# Patient Record
Sex: Male | Born: 2000 | Race: White | Hispanic: No | Marital: Single | Smoking: Never smoker
Health system: Southern US, Community
[De-identification: ages and names within clinical notes are randomized; demographics above are authoritative.]

---
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
## 2018-01-22 ENCOUNTER — Encounter: Payer: Self-pay | Admitting: Family Medicine

## 2018-01-22 ENCOUNTER — Ambulatory Visit (INDEPENDENT_AMBULATORY_CARE_PROVIDER_SITE_OTHER): Payer: 59 | Admitting: Family Medicine

## 2018-01-22 VITALS — BP 126/72 | HR 80 | Temp 98.1°F | Resp 12 | Ht 72.0 in | Wt 128.0 lb

## 2018-01-22 DIAGNOSIS — H9202 Otalgia, left ear: Secondary | ICD-10-CM | POA: Diagnosis not present

## 2018-01-22 DIAGNOSIS — J069 Acute upper respiratory infection, unspecified: Secondary | ICD-10-CM

## 2018-01-22 MED ORDER — NEOMYCIN-POLYMYXIN-HC 3.5-10000-1 OT SOLN: 3.0000 [drp] | Freq: Four times a day (QID) | OTIC | 0 refills | Status: AC

## 2018-01-22 NOTE — Progress Notes (Signed)
Patient ID: Henry Garrison, male    DOB: [birth year], 17 y.o.   MRN: [id number]  PCP: Donita BrooksPickard, Warren T, MD  Chief Complaint  Patient presents with  . Left ear pain    Subjective:   Henry ModenaZachary Hornaday is a 17 y.o. male, presents to clinic with CC of recurrent left ear pain, described as a lot pressure, pain rated 9/10, onset last night, has been constant since then.  He has a history of recurrent ear infections.  He has not tried any medications or drops for his pain since the beginning.  He reports cleaning his ears with q-tips after swimming and after showers (swimming maybe 2x a month).  He did clean his ear prior to the onset of pain.   He denies any associated redness, ear drainage, decreased hearing, headaches, URI symptoms, fever, lymphadenopathy, sweats.   Patient Active Problem List   Diagnosis Date Noted  . 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 Hyperextension injury of right knee 09/09/2014  . 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 Hypertension Mother   . Cancer Father        cervical  . Asthma Sister   . Hypertension Maternal Grandmother   . Hypertension Maternal Grandfather      Social History   Socioeconomic History  . Marital status: Single    Spouse name: Not on file  . Number of children: Not on file  . Years of education: Not on file  . Highest education level: Not on file  Occupational History  . Not on file  Social Needs  . Financial resource strain: Not on file  . Food insecurity:    Worry: Not on file    Inability: Not on file  . Transportation needs:    Medical: Not on file    Non-medical: Not on file  Tobacco Use  . Smoking status: Never Smoker  . Smokeless tobacco: Former Engineer, waterUser  Substance and Sexual Activity  . Alcohol use: No    Alcohol/week: 0.0 oz  . Drug use: No  . Sexual  activity: Never  Lifestyle  . Physical activity:    Days per week: Not on file    Minutes per session: Not on file  . Stress: Not on file  Relationships  . Social connections:    Talks on phone: Not on file    Gets together: Not on file    Attends religious service: Not on file    Active member of club or organization: Not on file    Attends meetings of clubs or organizations: Not on file    Relationship status: Not on file  . Intimate partner violence:    Fear of current or ex partner: Not on file    Emotionally abused: Not on file    Physically abused: Not on file    Forced sexual activity: Not on file  Other Topics Concern  . Not on file  Social History Narrative   ** Merged History Encounter **         Review of Systems  Constitutional: Negative.   HENT: Negative.   Eyes: Negative.   Respiratory: Negative.   Cardiovascular: Negative.   Gastrointestinal: Negative.   Endocrine: Negative.   Genitourinary: Negative.   Musculoskeletal: Negative.   Skin: Negative.   Allergic/Immunologic: Negative.  Neurological: Negative.   Hematological: Negative.   Psychiatric/Behavioral: Negative.   All other systems reviewed and are negative.      Objective:    Vitals:   01/22/18 1110  BP: 126/72  Pulse: 80  Resp: 12  Temp: 98.1 F (36.7 C)  TempSrc: Oral  SpO2: 99%  Weight: 128 lb (58.1 kg)  Height: 6' (1.829 m)      Physical Exam  Constitutional: He appears well-developed.  HENT:  Head: Normocephalic and atraumatic. Head is without right periorbital erythema and without left periorbital erythema.  Right Ear: Hearing, tympanic membrane, external ear and ear canal normal.  Left Ear: Hearing, tympanic membrane and external ear normal. No lacerations. There is tenderness. No drainage or swelling. No mastoid tenderness. Tympanic membrane is not injected and not bulging.  No middle ear effusion. No hemotympanum.  Nose: Mucosal edema and rhinorrhea present.    Mouth/Throat: Uvula is midline. Posterior oropharyngeal erythema present. No oropharyngeal exudate, posterior oropharyngeal edema or tonsillar abscesses.  Mild left external auditory canal erythema  Eyes: Conjunctivae are normal. Right eye exhibits no discharge. Left eye exhibits no discharge.  Neck: No tracheal deviation present.  Cardiovascular: Normal rate and regular rhythm.  Pulmonary/Chest: Effort normal. No stridor. No respiratory distress.  Musculoskeletal: Normal range of motion.  Neurological: He is alert. He exhibits normal muscle tone. Coordination normal.  Skin: Skin is warm and dry. No rash noted.  Psychiatric: He has a normal mood and affect. His behavior is normal.  Nursing note and vitals reviewed.         Assessment & Plan:      ICD-10-CM   1. Otalgia of left ear H92.02 neomycin-polymyxin-hydrocortisone (CORTISPORIN) OTIC solution  2. Upper respiratory tract infection, unspecified type J06.9     Patient presents with left ear pain reports a history of recurrent outer ear infections requiring oral antibiotics, he denies any other symptoms.  He does use Q-tips in his ears a lot.  Left external auditory canal is mildly erythematous without any other abnormalities of his left ear.  He appears to have mild URI with nasal mucosa and posterior oropharynx erythema.  May have early otitis externa or referred ear pain from URI.  Because of the chronic nature of his otalgia will give Cortisporin drops.  Instructed patient not to use Q-tips anymore.  Can use allergy medications such as antihistamines, nasal steroids or decongestants to try and help with symptoms.  Patient to follow-up if he has any worsening of URI or ear symptoms.  Patient verbalizes understanding of plan.   Danelle Berry, PA-C 01/22/18 11:36 AM

## 2018-01-22 NOTE — Patient Instructions (Addendum)
Call Friday if worsening ear pain   Ear Drops, Adult Your doctor has found that you have a condition that requires you to use ear drops. Ear drops are a medicine that is placed in the ear. This sheet gives you information about how to use this medicine. Your doctor may also give you more specific instructions. Supplies needed:  Cotton ball.  Medicine. How to put ear drops into your ear 1. Wash your hands with soap and water. 2. Make sure your ears are clean and dry. 3. Warm the medicine by holding it in your hand for a few minutes. 4. Shake the medicine to mix the ear drops. 5. Use the tube to get the medicine. You will need to squeeze the round part of the tube to do this. 6. Put the drops in your ear as told. Hold the tube above your ear. Do not let the tube touch your ear. The medicine may go in easier if you pull the flap of your ear up and back while you put the drops in. 7. To make sure your ear soaks up the medicine, do one of these things: ? Lie down for 10 minutes. The ear with the medicine should face up. ? Put a cotton ball in your ear. Do not push it deeper into your ear. Take out the cotton ball when the drops have been soaked up. 8. If you need to put drops in your other ear, repeat the same steps. Your doctor will tell you if you should put drops in both ears. Follow these instructions at home:  Use the ear drops for as long as your doctor tells you to. Do not stop even if your symptoms get better.  Keep the ear drops at room temperature.  Keep all follow-up visits as told by your doctor. This is important. Contact a doctor if:  Your condition gets worse.  Your pain gets worse.  Unusual fluid (drainage) is coming from your ear, especially if the fluid stinks.  You have trouble hearing. Get help right away if:  You feel like the room is spinning and you feel sick to your stomach. This condition is called vertigo.  The outside of your ear becomes red or  swollen.  You have a very bad headache. Summary  Ear drops are a medicine that is put in the ear.  Put the drops in your ear as told by your doctor.  Use the ear drops for as long as your doctor tells you to. Do not stop even if your symptoms get better. This information is not intended to replace advice given to you by your health care provider. Make sure you discuss any questions you have with your health care provider. Document Released: 11/29/2009 Document Revised: 06/15/2016 Document Reviewed: 06/15/2016 Elsevier Interactive Patient Education  2017 [employer] Inc.   Viral Respiratory Infection A viral respiratory infection is an illness that affects parts of the body used for breathing, like the lungs, nose, and throat. It is caused by a germ called a virus. Some examples of this kind of infection are:  A cold.  The flu (influenza).  A respiratory syncytial virus (RSV) infection.  How do I know if I have this infection? Most of the time this infection causes:  A stuffy or runny nose.  Yellow or green fluid in the nose.  A cough.  Sneezing.  Tiredness (fatigue).  Achy muscles.  A sore throat.  Sweating or chills.  A fever.  A headache.  How is this infection treated? If the flu is diagnosed early, it may be treated with an antiviral medicine. This medicine shortens the length of time a person has symptoms. Symptoms may be treated with over-the-counter and prescription medicines, such as:  Expectorants. These make it easier to cough up mucus.  Decongestant nasal sprays.  Doctors do not prescribe antibiotic medicines for viral infections. They do not work with this kind of infection. How do I know if I should stay home? To keep others from getting sick, stay home if you have:  A fever.  A lasting cough.  A sore throat.  A runny nose.  Sneezing.  Muscles aches.  Headaches.  Tiredness.  Weakness.  Chills.  Sweating.  An upset stomach  (nausea).  Follow these instructions at home:  Rest as much as possible.  Take over-the-counter and prescription medicines only as told by your doctor.  Drink enough fluid to keep your pee (urine) clear or pale yellow.  Gargle with salt water. Do this 3-4 times per day or as needed. To make a salt-water mixture, dissolve -1 tsp of salt in 1 cup of warm water. Make sure the salt dissolves all the way.  Use nose drops made from salt water. This helps with stuffiness (congestion). It also helps soften the skin around your nose.  Do not drink alcohol.  Do not use tobacco products, including cigarettes, chewing tobacco, and e-cigarettes. If you need help quitting, ask your doctor. Get help if:  Your symptoms last for 10 days or longer.  Your symptoms get worse over time.  You have a fever.  You have very bad pain in your face or forehead.  Parts of your jaw or neck become very swollen. Get help right away if:  You feel pain or pressure in your chest.  You have shortness of breath.  You faint or feel like you will faint.  You keep throwing up (vomiting).  You feel confused. This information is not intended to replace advice given to you by your health care provider. Make sure you discuss any questions you have with your health care provider. Document Released: 05/24/2008 Document Revised: 11/17/2015 Document Reviewed: 11/17/2014 Elsevier Interactive Patient Education  2018 [employer]

## 2018-02-11 ENCOUNTER — Emergency Department (HOSPITAL_COMMUNITY)
Admission: EM | Admit: 2018-02-11 | Discharge: 2018-02-11 | Disposition: A | Discharge status: Home / Self Care | Payer: BLUE CROSS/BLUE SHIELD | Attending: Emergency Medicine | Admitting: Emergency Medicine

## 2018-02-11 ENCOUNTER — Other Ambulatory Visit: Payer: Self-pay

## 2018-02-11 ENCOUNTER — Encounter (HOSPITAL_COMMUNITY): Payer: Self-pay | Admitting: *Deleted

## 2018-02-11 DIAGNOSIS — H5713 Ocular pain, bilateral: Secondary | ICD-10-CM | POA: Diagnosis not present

## 2018-02-11 DIAGNOSIS — H16133 Photokeratitis, bilateral: Secondary | ICD-10-CM | POA: Insufficient documentation

## 2018-02-11 MED ORDER — FLUORESCEIN SODIUM 1 MG OP STRP
1.0000 | ORAL_STRIP | Freq: Once | OPHTHALMIC | Status: AC
  Administered 2018-02-11: 1 via OPHTHALMIC
  Filled 2018-02-11: qty 1

## 2018-02-11 MED ORDER — TETRACAINE HCL 0.5 % OP SOLN
1.0000 [drp] | Freq: Once | OPHTHALMIC | Status: AC
  Administered 2018-02-11: 1 [drp] via OPHTHALMIC
  Filled 2018-02-11: qty 4

## 2018-02-11 MED ORDER — ERYTHROMYCIN 5 MG/GM OP OINT: TOPICAL_OINTMENT | OPHTHALMIC | 0 refills | Status: DC

## 2018-02-11 MED ORDER — HYDROCODONE-ACETAMINOPHEN 5-325 MG PO TABS: 1.0000 | ORAL_TABLET | Freq: Four times a day (QID) | ORAL | 0 refills | Status: DC | PRN

## 2018-02-11 NOTE — ED Provider Notes (Signed)
[facility]   CSN: [id number] Arrival date & time: 02/11/18  0448     History   Chief Complaint Chief Complaint  Patient presents with  . Eye Injury    HPI Henry Garrison is a 17 y.o. male.  Patient presents to the emergency department with a chief complaint of bilateral eye pain.  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  He states that by the time he had gone to bed he was starting to feel eye pain in both eyes.  He states that he was awakened this morning with severe eye pain. It is worsened when he opens his eyes. He denies flashes or floaters.  The history is provided by the patient. No language interpreter was used.    History reviewed. No pertinent past medical history.  Patient Active Problem List   Diagnosis Date Noted  . 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 Hyperextension injury of right knee 09/09/2014  . 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 No pertinent surgical history.      Home Medications    Prior to Admission medications   Not on File    Family History Family History  Problem Relation Age of Onset  . Hypertension Mother   . Cancer Father        cervical  . Asthma Sister   . Hypertension Maternal Grandmother   . Hypertension Maternal Grandfather     Social History Social History   Tobacco Use  . Smoking status: Never Smoker  . Smokeless tobacco: Former Engineer, waterUser  Substance Use Topics  . Alcohol use: No    Alcohol/week: 0.0 standard drinks  . Drug use: No     Allergies   Patient has no known allergies.   Review of Systems Review of Systems  All other systems reviewed and are negative.    Physical Exam Updated Vital Signs BP (!) 135/94   Pulse (!) 58   Temp 97.6 F (36.4 C)   Resp 16   SpO2 97%   Physical Exam  Constitutional: He is oriented to  person, place, and time. No distress.  HENT:  Head: Normocephalic and atraumatic.  Eyes: Pupils are equal, round, and reactive to light. Conjunctivae and EOM are normal.  Trace fluorescein uptake/dimpling  Neck: No tracheal deviation present.  Cardiovascular: Normal rate.  Pulmonary/Chest: Effort normal. No respiratory distress.  Abdominal: Soft.  Musculoskeletal: Normal range of motion.  Neurological: He is alert and oriented to person, place, and time.  Skin: Skin is warm and dry. He is not diaphoretic.  Psychiatric: Judgment normal.  Nursing note and vitals reviewed.    ED Treatments / Results  Labs (all labs ordered are listed, but only abnormal results are displayed) Labs Reviewed - No data to display  EKG None  Radiology No results found.  Procedures Procedures (including critical care time)  Medications Ordered in ED Medications  tetracaine (PONTOCAINE) 0.5 % ophthalmic solution 1 drop (has no administration in time range)  fluorescein ophthalmic strip 1 strip (has no administration in time range)     Initial Impression / Assessment and Plan / ED Course  I have reviewed the triage vital signs and the nursing notes.  Pertinent labs & imaging results that were available during my care of the patient were reviewed by me and considered in my medical decision making (see chart  for details).     Patient with photokeratitis from welder's flash. Will give some pain medicine and romycin.  Ophthalmology follow-up.  Final Clinical Impressions(s) / ED Diagnoses   Final diagnoses:  Welders' flash, bilateral    ED Discharge Orders         Ordered    erythromycin ophthalmic ointment  Status:  Discontinued     02/11/18 0538    erythromycin ophthalmic ointment     02/11/18 0539    HYDROcodone-acetaminophen (NORCO/VICODIN) 5-325 MG tablet  Every 6 hours PRN     02/11/18 0539           Roxy HorsemanBrowning, Osualdo Hansell, PA-C 02/11/18 0542    Eudelia Bunchardama, Amadeo GarnetPedro Eduardo,  MD 02/11/18 [phone]

## 2018-02-11 NOTE — ED Triage Notes (Signed)
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 Reports being able to see but has increased pain when eyes are open

## 2018-06-10 ENCOUNTER — Encounter: Payer: Self-pay | Admitting: Family Medicine

## 2018-06-10 ENCOUNTER — Ambulatory Visit (INDEPENDENT_AMBULATORY_CARE_PROVIDER_SITE_OTHER): Payer: BLUE CROSS/BLUE SHIELD | Admitting: Family Medicine

## 2018-06-10 VITALS — BP 100/64 | HR 129 | Temp 100.6°F | Resp 14 | Ht 72.0 in | Wt 128.0 lb

## 2018-06-10 DIAGNOSIS — R509 Fever, unspecified: Secondary | ICD-10-CM | POA: Diagnosis not present

## 2018-06-10 DIAGNOSIS — R52 Pain, unspecified: Secondary | ICD-10-CM | POA: Diagnosis not present

## 2018-06-10 LAB — INFLUENZA A AND B AG, IMMUNOASSAY
INFLUENZA A ANTIGEN: NOT DETECTED
INFLUENZA B ANTIGEN: NOT DETECTED

## 2018-06-10 MED ORDER — OSELTAMIVIR PHOSPHATE 75 MG PO CAPS: 75.0000 mg | ORAL_CAPSULE | Freq: Two times a day (BID) | ORAL | 0 refills | Status: DC

## 2018-06-10 NOTE — Progress Notes (Signed)
Subjective:    Patient ID: Henry Garrison, male    DOB: September 30, 2000, 17 y.o.   MRN: [id number]  HPI  Patient has flulike symptoms starting yesterday.  Patient reports fever to 102.5.  He reports diffuse body aches all over his body.  He reports a dull headache.  He reports some mild sinus congestion and rhinorrhea.  He reports a mild sore throat.  He reports a dry nonproductive cough.  He denies any nausea or vomiting.  He denies any chest pain or shortness of breath.  Flu test is negative No past medical history on file. No past surgical history on file. No current outpatient medications on file prior to visit.   No current facility-administered medications on file prior to visit.    No Known Allergies Social History   Socioeconomic History  . Marital status: Single    Spouse name: Not on file  . Number of children: Not on file  . Years of education: Not on file  . Highest education level: Not on file  Occupational History  . Not on file  Social Needs  . Financial resource strain: Not on file  . Food insecurity:    Worry: Not on file    Inability: Not on file  . Transportation needs:    Medical: Not on file    Non-medical: Not on file  Tobacco Use  . Smoking status: Never Smoker  . Smokeless tobacco: Former Engineer, water and Sexual Activity  . Alcohol use: No    Alcohol/week: 0.0 standard drinks  . Drug use: No  . Sexual activity: Never  Lifestyle  . Physical activity:    Days per week: Not on file    Minutes per session: Not on file  . Stress: Not on file  Relationships  . Social connections:    Talks on phone: Not on file    Gets together: Not on file    Attends religious service: Not on file    Active member of club or organization: Not on file    Attends meetings of clubs or organizations: Not on file    Relationship status: Not on file  . Intimate partner violence:    Fear of current or ex partner: Not on file    Emotionally abused: Not on file   Physically abused: Not on file    Forced sexual activity: Not on file  Other Topics Concern  . Not on file  Social History Narrative   ** Merged History Encounter **         Review of Systems  All other systems reviewed and are negative.      Objective:   Physical Exam Vitals signs reviewed.  Constitutional:      General: He is not in acute distress.    Appearance: Normal appearance. He is normal weight. He is not ill-appearing, toxic-appearing or diaphoretic.  HENT:     Right Ear: Tympanic membrane and ear canal normal.     Left Ear: Tympanic membrane and ear canal normal.     Nose: Nose normal. No congestion or rhinorrhea.     Mouth/Throat:     Pharynx: No oropharyngeal exudate.  Eyes:     Conjunctiva/sclera: Conjunctivae normal.  Neck:     Musculoskeletal: Normal range of motion and neck supple.  Cardiovascular:     Rate and Rhythm: Normal rate and regular rhythm.     Pulses: Normal pulses.     Heart sounds: No murmur.  Pulmonary:  Effort: Pulmonary effort is normal.     Breath sounds: Normal breath sounds. No wheezing, rhonchi or rales.  Abdominal:     General: Bowel sounds are normal. There is no distension.     Palpations: Abdomen is soft.     Tenderness: There is no abdominal tenderness. There is no guarding or rebound.  Lymphadenopathy:     Cervical: No cervical adenopathy.  Neurological:     Mental Status: He is alert.           Assessment & Plan:  Fever, unspecified fever cause - Plan: Influenza A and B Ag, Immunoassay  Body aches - Plan: Influenza A and B Ag, Immunoassay  Despite the negative flu test, the patient symptoms sound classic for influenza.  I will start him on Tamiflu 75 mg p.o. twice daily for 5 days.  I encouraged the patient to drink plenty of fluids and to take ibuprofen 800 mg every 8 hours.  Recheck in 24 hours if worsening.  Anticipate gradual improvement over the next 5 to 6 days

## 2018-09-17 ENCOUNTER — Encounter: Payer: Self-pay | Admitting: Family Medicine

## 2018-09-17 ENCOUNTER — Telehealth: Payer: Self-pay | Admitting: Family Medicine

## 2018-09-17 NOTE — Telephone Encounter (Signed)
Pt called and states that he ate at the [employer] in [place] where an employee was positive for COVID-19 and he self quarantined on 09/11/18 and needs a note to go back to work after the quarantine.  Per Dr. Tanya Nones ok to write note. Note written for him to be out from 09/11/18 until 09/24/2018 and he may return on 09/25/2018 providing her has not developed any symptoms. Note written and signed. Await return call from pt for fax number to send note.

## 2018-10-17 ENCOUNTER — Ambulatory Visit (INDEPENDENT_AMBULATORY_CARE_PROVIDER_SITE_OTHER): Payer: BLUE CROSS/BLUE SHIELD | Admitting: Family Medicine

## 2018-10-17 ENCOUNTER — Other Ambulatory Visit: Payer: Self-pay

## 2018-10-17 DIAGNOSIS — J329 Chronic sinusitis, unspecified: Secondary | ICD-10-CM

## 2018-10-17 MED ORDER — AMOXICILLIN 875 MG PO TABS: 875.0000 mg | ORAL_TABLET | Freq: Two times a day (BID) | ORAL | 0 refills | Status: DC

## 2018-10-17 MED ORDER — FLUTICASONE PROPIONATE 50 MCG/ACT NA SUSP: 2.0000 | Freq: Every day | NASAL | 6 refills | Status: DC

## 2018-10-17 MED ORDER — LEVOCETIRIZINE DIHYDROCHLORIDE 5 MG PO TABS: 5.0000 mg | ORAL_TABLET | Freq: Every evening | ORAL | 0 refills | Status: DC

## 2018-10-17 NOTE — Progress Notes (Signed)
Subjective:    Patient ID: Henry Garrison, male    DOB: 31-Oct-2000, 18 y.o.   MRN: [id number]  HPI Patient is being seen today over the telephone.  He consents to be seen over the telephone.  He is currently at home.  I am currently in my office.  Phone call began at [phone].  Phone call ended at 931.  Patient states for last 2 or 3 days he has had swelling underneath both eyes.  He is had pressure under both eyes.  He is had sinus congestion and drainage.  He has a history of allergies.  He is not taking any allergy medicine.  He did try some Benadryl last night that helped a little bit but not significantly.  He denies any fevers or chills.  He denies any severe headaches.  He denies any cough or shortness of breath.  He denies any dental pain No past medical history on file. No past surgical history on file. Current Outpatient Medications on File Prior to Visit  Medication Sig Dispense Refill  . oseltamivir (TAMIFLU) 75 MG capsule Take 1 capsule (75 mg total) by mouth 2 (two) times daily. (Patient not taking: Reported on 10/17/2018) 10 capsule 0   No current facility-administered medications on file prior to visit.    No Known Allergies Social History   Socioeconomic History  . Marital status: Single    Spouse name: Not on file  . Number of children: Not on file  . Years of education: Not on file  . Highest education level: Not on file  Occupational History  . Not on file  Social Needs  . Financial resource strain: Not on file  . Food insecurity:    Worry: Not on file    Inability: Not on file  . Transportation needs:    Medical: Not on file    Non-medical: Not on file  Tobacco Use  . Smoking status: Never Smoker  . Smokeless tobacco: Former Engineer, waterUser  Substance and Sexual Activity  . Alcohol use: No    Alcohol/week: 0.0 standard drinks  . Drug use: No  . Sexual activity: Never  Lifestyle  . Physical activity:    Days per week: Not on file    Minutes per session: Not on file   . Stress: Not on file  Relationships  . Social connections:    Talks on phone: Not on file    Gets together: Not on file    Attends religious service: Not on file    Active member of club or organization: Not on file    Attends meetings of clubs or organizations: Not on file    Relationship status: Not on file  . Intimate partner violence:    Fear of current or ex partner: Not on file    Emotionally abused: Not on file    Physically abused: Not on file    Forced sexual activity: Not on file  Other Topics Concern  . Not on file  Social History Narrative   ** Merged History Encounter **          Review of Systems  All other systems reviewed and are negative.      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  I recommended Xyzal 5 mg a day and Flonase 2 sprays each nostril daily.  I recommend he try this for the next for 5 days.  If symptoms do not improve or if they worsen drastically and he develops fever and pain he can then take amoxicillin 875 mg p.o. twice daily for 10 days for possible secondary sinusitis but I believe the majority of his symptoms are due to allergies based on his description.

## 2019-02-17 ENCOUNTER — Other Ambulatory Visit: Payer: Self-pay

## 2019-02-17 ENCOUNTER — Encounter: Payer: Self-pay | Admitting: Family Medicine

## 2019-02-17 NOTE — Progress Notes (Signed)
   Subjective:    Patient ID: Henry Garrison, male    DOB: 2000-08-27, 18 y.o.   MRN: [id number]  HPI Patient was scheduled for 1215.  I called the patient initially at 1220 with no answer at [phone].  I repeated this phone call at 1221.  There is no voicemail so I could not leave a message.  No one answered the telephone.  This is the only phone number in his chart.  I called the patient back at 1250.  Again no one answered the telephone.  I am unable to leave a voice message as there is no voicemail box.  I have no other phone number to contact this patient.   Review of Systems     Objective:   Physical Exam        Assessment & Plan:

## 2019-05-28 ENCOUNTER — Encounter: Payer: Self-pay | Admitting: Family Medicine

## 2019-05-28 ENCOUNTER — Ambulatory Visit (INDEPENDENT_AMBULATORY_CARE_PROVIDER_SITE_OTHER): Payer: BLUE CROSS/BLUE SHIELD | Admitting: Family Medicine

## 2019-05-28 ENCOUNTER — Telehealth: Payer: Self-pay | Admitting: Family Medicine

## 2019-05-28 ENCOUNTER — Other Ambulatory Visit: Payer: Self-pay

## 2019-05-28 VITALS — BP 130/78 | HR 68 | Temp 97.2°F | Resp 14 | Ht 72.0 in | Wt 138.0 lb

## 2019-05-28 DIAGNOSIS — R109 Unspecified abdominal pain: Secondary | ICD-10-CM | POA: Diagnosis not present

## 2019-05-28 MED ORDER — PANTOPRAZOLE SODIUM 40 MG PO TBEC: 40.0000 mg | DELAYED_RELEASE_TABLET | Freq: Two times a day (BID) | ORAL | 3 refills | Status: DC

## 2019-05-28 NOTE — Telephone Encounter (Signed)
Mother called and states [facility] is going to send an approval over for the protonix that Dr. Dennard Schaumann just wrote today they said this medication is normally taken once a day and it will need to be approved for twice a day. She would like to know if it would be okay if he only took one a day. If so please send in new prescription.  CB# [phone]

## 2019-05-28 NOTE — Progress Notes (Signed)
Subjective:    Patient ID: Henry Garrison, male    DOB: 2001-06-13, 18 y.o.   MRN: [id number]  HPI  Ever since Thanksgiving, the patient reports sharp left sided abdominal pain within 15 minutes to an hour of eating.  It always occurs within an hour of eating.  It last minutes to hours.  He points to his left lower quadrant below the margin of his ribs.  He denies any melena.  He denies any hematochezia.  He denies any constipation.  He denies any diarrhea.  He denies any change in his bowel movements.  He denies any fevers or chills or nausea or vomiting.  His abdominal exam today is completely normal.  His abdomen is soft nondistended nontender with normal bowel sounds.  The association with eating within an hour of eating raises suspicion for a possible ulcer however the location of his pain seems to be inferior to the stomach.  Intestinal spasms would also be another possibility.  His exam today shows no evidence of an acute abdomen. No past medical history on file. No past surgical history on file. No current outpatient medications on file prior to visit.   No current facility-administered medications on file prior to visit.    No Known Allergies Social History   Socioeconomic History  . Marital status: Single    Spouse name: Not on file  . Number of children: Not on file  . Years of education: Not on file  . Highest education level: Not on file  Occupational History  . Not on file  Social Needs  . Financial resource strain: Not on file  . Food insecurity    Worry: Not on file    Inability: Not on file  . Transportation needs    Medical: Not on file    Non-medical: Not on file  Tobacco Use  . Smoking status: Never Smoker  . Smokeless tobacco: Former Network engineer and Sexual Activity  . Alcohol use: No    Alcohol/week: 0.0 standard drinks  . Drug use: No  . Sexual activity: Never  Lifestyle  . Physical activity    Days per week: Not on file    Minutes per session:  Not on file  . Stress: Not on file  Relationships  . Social Herbalist on phone: Not on file    Gets together: Not on file    Attends religious service: Not on file    Active member of club or organization: Not on file    Attends meetings of clubs or organizations: Not on file    Relationship status: Not on file  . Intimate partner violence    Fear of current or ex partner: Not on file    Emotionally abused: Not on file    Physically abused: Not on file    Forced sexual activity: Not on file  Other Topics Concern  . Not on file  Social History Narrative   ** Merged History Encounter **          Review of Systems  All other systems reviewed and are negative.      Objective:   Physical Exam Vitals signs reviewed.  Constitutional:      General: He is not in acute distress.    Appearance: Normal appearance. He is normal weight. He is not ill-appearing or toxic-appearing.  Cardiovascular:     Rate and Rhythm: Normal rate and regular rhythm.     Pulses: Normal pulses.  Heart sounds: Normal heart sounds.  Pulmonary:     Effort: Pulmonary effort is normal.     Breath sounds: Normal breath sounds.  Abdominal:     General: Abdomen is flat. Bowel sounds are normal. There is no distension.     Palpations: Abdomen is soft. There is no mass.     Tenderness: There is no abdominal tenderness. There is no guarding or rebound.     Hernia: No hernia is present.    Neurological:     Mental Status: He is alert.           Assessment & Plan:  Left lateral abdominal pain  The description raises suspicion for gastritis or even an ulcer however the location is confusing and seems to be lower than 1 would expect for that.  I am going to treat the patient based on the history.  I believe the pain may be referred lower.  Therefore we will start Protonix 40 mg twice daily and have asked the patient to give me an update in 1 week or immediately if worsening.  If symptoms  worsen or change, proceed with a CT scan of the abdomen and pelvis however if symptoms are improving over the next week I would treat the patient presumptively for gastritis/PUD and at that time obtain an H. pylori breath test.  Meanwhile refrain from any NSAIDs or alcohol

## 2019-05-29 MED ORDER — PANTOPRAZOLE SODIUM 40 MG PO TBEC: 40.0000 mg | DELAYED_RELEASE_TABLET | Freq: Every day | ORAL | 3 refills | Status: DC

## 2019-05-29 NOTE — Telephone Encounter (Signed)
Spoke to pt's mother. Will send in for once a day dosing for ins and informed her to be sure he takes it bid at least for 1 week and let us know how he is doing. She verbalized understanding and med sent to pharm.

## 2019-06-03 ENCOUNTER — Emergency Department
Admission: EM | Admit: 2019-06-03 | Discharge: 2019-06-03 | Disposition: A | Discharge status: Home / Self Care | Payer: BC Managed Care – PPO | Attending: Student in an Organized Health Care Education/Training Program | Admitting: Student in an Organized Health Care Education/Training Program

## 2019-06-03 ENCOUNTER — Other Ambulatory Visit: Payer: Self-pay

## 2019-06-03 DIAGNOSIS — S6992XA Unspecified injury of left wrist, hand and finger(s), initial encounter: Secondary | ICD-10-CM | POA: Diagnosis not present

## 2019-06-03 DIAGNOSIS — Y998 Other external cause status: Secondary | ICD-10-CM | POA: Diagnosis not present

## 2019-06-03 DIAGNOSIS — Y9389 Activity, other specified: Secondary | ICD-10-CM | POA: Diagnosis not present

## 2019-06-03 DIAGNOSIS — Y92018 Other place in single-family (private) house as the place of occurrence of the external cause: Secondary | ICD-10-CM | POA: Insufficient documentation

## 2019-06-03 DIAGNOSIS — W268XXA Contact with other sharp object(s), not elsewhere classified, initial encounter: Secondary | ICD-10-CM | POA: Insufficient documentation

## 2019-06-03 DIAGNOSIS — Z79899 Other long term (current) drug therapy: Secondary | ICD-10-CM | POA: Insufficient documentation

## 2019-06-03 DIAGNOSIS — S61211A Laceration without foreign body of left index finger without damage to nail, initial encounter: Secondary | ICD-10-CM | POA: Insufficient documentation

## 2019-06-03 DIAGNOSIS — Z23 Encounter for immunization: Secondary | ICD-10-CM | POA: Diagnosis not present

## 2019-06-03 MED ORDER — TETANUS-DIPHTH-ACELL PERTUSSIS 5-2.5-18.5 LF-MCG/0.5 IM SUSP
0.5000 mL | Freq: Once | INTRAMUSCULAR | Status: AC
  Administered 2019-06-03: 22:00:00 0.5 mL via INTRAMUSCULAR
  Filled 2019-06-03: qty 0.5

## 2019-06-03 MED ORDER — CEPHALEXIN 500 MG PO CAPS: 1000.0000 mg | ORAL_CAPSULE | Freq: Two times a day (BID) | ORAL | 0 refills | Status: DC

## 2019-06-03 NOTE — ED Notes (Signed)
Patient cut left index finger at second knuckle. Finger has multiple cuts across knuckle. Patient denies pain at this time. Finger has bandage covering cuts with bleeding controlled.

## 2019-06-03 NOTE — ED Provider Notes (Signed)
[facility] Emergency Department Provider Note  ____________________________________________  Time seen: Approximately 9:16 PM  I have reviewed the triage vital signs and the nursing notes.   HISTORY  Chief Complaint Laceration    HPI Henry Garrison is a 18 y.o. male who presents the emergency department with lacerations to the index finger left hand.  Patient accidentally cut his finger on a serrated sawblade.  Patient has multiple lacerations around the PIP joint.  Full range of motion to the digit.  No active bleeding.  Last tetanus shot around age 61.  No other injury or complaint.  No medications prior to arrival.         No past medical history on file.  Patient Active Problem List   Diagnosis Date Noted  . 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 Hyperextension injury of right knee 09/09/2014  . 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  Prior to Admission medications   Medication Sig Start Date End Date Taking? Authorizing Provider  cephALEXin (KEFLEX) 500 MG capsule Take 2 capsules (1,000 mg total) by mouth 2 (two) times daily. 06/03/19   Vanita Cannell, Delorise Royals, PA-C  pantoprazole (PROTONIX) 40 MG tablet Take 1 tablet (40 mg total) by mouth daily. 05/29/19   Donita Brooks, MD    Allergies Patient has no known allergies.  Family History  Problem Relation Age of Onset  . Hypertension Mother   . Cancer Father        cervical  . Asthma Sister   . Hypertension Maternal Grandmother   . Hypertension Maternal Grandfather     Social History Social History   Tobacco Use  . Smoking status: Never Smoker  . Smokeless tobacco: Former Engineer, water Use Topics  . Alcohol use: No    Alcohol/week: 0.0 standard drinks  . Drug use: No     Review of Systems  Constitutional: No fever/chills Eyes: No visual changes. No discharge ENT: No upper respiratory complaints. Cardiovascular:  no chest pain. Respiratory: no cough. No SOB. Gastrointestinal: No abdominal pain.  No nausea, no vomiting. Musculoskeletal: Negative for musculoskeletal pain. Skin: Lacerations to the index finger of the left hand Neurological: Negative for headaches, focal weakness or numbness. 10-point ROS otherwise negative.  ____________________________________________   PHYSICAL EXAM:  VITAL SIGNS: ED Triage Vitals [06/03/19 2019]  Enc Vitals Group     BP 129/73     Pulse Rate 77     Resp 20     Temp 98.2 F (36.8 C)     Temp Source Oral     SpO2 100 %     Weight 140 lb (63.5 kg)     Height 6' (1.829 m)     Head Circumference      Peak Flow      Pain Score 4     Pain Loc      Pain Edu?      Excl. in GC?      Constitutional: Alert and oriented. Well appearing and in no acute distress. Eyes: Conjunctivae are normal. PERRL. EOMI. Head: Atraumatic. Neck: No stridor.    Cardiovascular: Normal rate, regular rhythm. Normal S1 and S2.  Good peripheral circulation. Respiratory: Normal respiratory effort without tachypnea or retractions. Lungs CTAB. Good air entry to the bases with no decreased or absent breath sounds. Musculoskeletal: Full range of motion to all extremities. No gross deformities appreciated. Neurologic:  Normal speech and language. No gross focal neurologic  deficits are appreciated.  Skin:  Skin is warm, dry and intact. No rash noted.  Multiple superficial lacerations noted about the dorsal aspect of the PIP joint index finger left hand.  These are consistent with a serrated blade.  Given orientation of multiple lacerations, edges would not be able to be approximated well.  No active bleeding.  No foreign body.  Full range of motion to the digit.  Sensation capillary refill intact distally. Psychiatric: Mood and affect are normal. Speech and behavior are normal. Patient exhibits appropriate insight and judgement.   ____________________________________________   LABS (all  labs ordered are listed, but only abnormal results are displayed)  Labs Reviewed - No data to display ____________________________________________  EKG   ____________________________________________  RADIOLOGY   No results found.  ____________________________________________    PROCEDURES  Procedure(s) performed:    Marland KitchenMarland KitchenLaceration Repair  Date/Time: 06/03/2019 10:12 PM Performed by: Darletta Moll, PA-C Authorized by: Darletta Moll, PA-C   Consent:    Consent obtained:  Verbal   Consent given by:  Patient   Risks discussed:  Pain, infection and poor wound healing Anesthesia (see MAR for exact dosages):    Anesthesia method:  None Laceration details:    Location:  Finger   Finger location:  L index finger   Length (cm):  2 Repair type:    Repair type:  Simple Exploration:    Hemostasis achieved with:  Direct pressure   Wound exploration: wound explored through full range of motion and entire depth of wound probed and visualized     Wound extent: no foreign bodies/material noted, no muscle damage noted, no nerve damage noted, no tendon damage noted, no underlying fracture noted and no vascular damage noted     Contaminated: no   Treatment:    Area cleansed with:  Betadine and saline   Amount of cleaning:  Extensive   Irrigation solution:  Sterile saline Skin repair:    Repair method:  Tissue adhesive Approximation:    Approximation:  Loose Post-procedure details:    Dressing:  Splint for protection   Patient tolerance of procedure:  Tolerated well, no immediate complications Comments:     Wound is thoroughly cleansed using Betadine, saline as well as a Betadine soak.  Patient tolerated well.  Dermabond is applied over multiple superficial lacerations to provide good sealing of the area.  Splint applied for protection.      Medications  Tdap (BOOSTRIX) injection 0.5 mL (has no administration in time range)      ____________________________________________   INITIAL IMPRESSION / ASSESSMENT AND PLAN / ED COURSE  Pertinent labs & imaging results that were available during my care of the patient were reviewed by me and considered in my medical decision making (see chart for details).  Review of the [place] CSRS was performed in accordance of the [place] prior to dispensing any controlled drugs.           Patient's diagnosis is consistent with finger laceration.  Patient presented to the emergency department with a laceration to the index finger of the left hand.  Patient had multiple parallel superficial lacerations.  Given these orientations unable to perform primary closure with sutures.  Area is sealed using Dermabond, splint applied for protection.  Patient is given antibiotics prophylactically.  Tetanus shot updated tonight.  Follow-up with primary care as needed..  Patient is given ED precautions to return to the ED for any worsening or new symptoms.     ____________________________________________  FINAL CLINICAL  IMPRESSION(S) / ED DIAGNOSES  Final diagnoses:  Laceration of left index finger without foreign body without damage to nail, initial encounter      NEW MEDICATIONS STARTED DURING THIS VISIT:  ED Discharge Orders         Ordered    cephALEXin (KEFLEX) 500 MG capsule  2 times daily     06/03/19 2210              This chart was dictated using voice recognition software/Dragon. Despite best efforts to proofread, errors can occur which can change the meaning. Any change was purely unintentional.    Racheal PatchesCuthriell, Kyrese Gartman D, PA-C 06/03/19 2214    Willy Eddyobinson, Patrick, MD 06/03/19 2238

## 2019-06-03 NOTE — ED Triage Notes (Addendum)
Pt was working with saw at home and cut his right index finger. Several small lacerations noted over the proximal inter-phalangeal joint.

## 2020-03-05 ENCOUNTER — Ambulatory Visit
Admission: EM | Admit: 2020-03-05 | Discharge: 2020-03-05 | Disposition: A | Discharge status: Home / Self Care | Payer: BC Managed Care – PPO | Attending: Emergency Medicine | Admitting: Emergency Medicine

## 2020-03-05 ENCOUNTER — Other Ambulatory Visit: Payer: Self-pay

## 2020-03-05 ENCOUNTER — Encounter: Payer: Self-pay | Admitting: Emergency Medicine

## 2020-03-05 DIAGNOSIS — R430 Anosmia: Secondary | ICD-10-CM | POA: Diagnosis not present

## 2020-03-05 DIAGNOSIS — Z20822 Contact with and (suspected) exposure to covid-19: Secondary | ICD-10-CM

## 2020-03-05 DIAGNOSIS — Z1152 Encounter for screening for COVID-19: Secondary | ICD-10-CM

## 2020-03-05 DIAGNOSIS — R059 Cough, unspecified: Secondary | ICD-10-CM

## 2020-03-05 DIAGNOSIS — R432 Parageusia: Secondary | ICD-10-CM

## 2020-03-05 DIAGNOSIS — R05 Cough: Secondary | ICD-10-CM

## 2020-03-05 NOTE — ED Triage Notes (Signed)
Pt here for cough and loss of taste and smell; pt had covid exposure last week

## 2020-03-05 NOTE — ED Provider Notes (Signed)
[facility]    CSN: [id number] Arrival date & time: 03/05/20  1237      History   Chief Complaint Chief Complaint  Patient presents with  . Cough    HPI Henry Garrison is a 19 y.o. male  Presenting for Covid testing: Exposure: Family members from Thursday through Sunday Any fever, symptoms since exposure: Yes-dry cough, loss of taste and smell.  No difficulty breathing, chest pain, fevers.   History reviewed. No pertinent past medical history.  Patient Active Problem List   Diagnosis Date Noted  . 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 Hyperextension injury of right knee 09/09/2014  . 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 No pertinent surgical history.     Home Medications    Prior to Admission medications   Medication Sig Start Date End Date Taking? Authorizing Provider  pantoprazole (PROTONIX) 40 MG tablet Take 1 tablet (40 mg total) by mouth daily. 05/29/19   Donita Brooks, MD    Family History Family History  Problem Relation Age of Onset  . Hypertension Mother   . Cancer Father        cervical  . Asthma Sister   . Hypertension Maternal Grandmother   . Hypertension Maternal Grandfather     Social History Social History   Tobacco Use  . Smoking status: Never Smoker  . Smokeless tobacco: Former Engineer, water Use Topics  . Alcohol use: No    Alcohol/week: 0.0 standard drinks  . Drug use: No     Allergies   Patient has no known allergies.   Review of Systems As per HPI   Physical Exam Triage Vital Signs ED Triage Vitals  Enc Vitals Group     BP 03/05/20 1404 115/81     Pulse Rate 03/05/20 1404 76     Resp 03/05/20 1404 18     Temp 03/05/20 1404 97.9 F (36.6 C)     Temp Source 03/05/20 1404 Oral     SpO2 03/05/20 1404 97 %     Weight --      Height --      Head Circumference --      Peak Flow --      Pain Score 03/05/20 1405 0     Pain Loc --      Pain Edu? --       Excl. in GC? --    No data found.  Updated Vital Signs BP 115/81 (BP Location: Right Arm)   Pulse 76   Temp 97.9 F (36.6 C) (Oral)   Resp 18   SpO2 97%   Visual Acuity Right Eye Distance:   Left Eye Distance:   Bilateral Distance:    Right Eye Near:   Left Eye Near:    Bilateral Near:     Physical Exam Constitutional:      General: He is not in acute distress. HENT:     Head: Normocephalic and atraumatic.  Eyes:     General: No scleral icterus.    Pupils: Pupils are equal, round, and reactive to light.  Cardiovascular:     Rate and Rhythm: Normal rate.  Pulmonary:     Effort: Pulmonary effort is normal. No respiratory distress.     Breath sounds: No wheezing.  Skin:    Coloration: Skin is not jaundiced or pale.  Neurological:     Mental Status: He is alert and oriented to person, place, and time.  UC Treatments / Results  Labs (all labs ordered are listed, but only abnormal results are displayed) Labs Reviewed  NOVEL CORONAVIRUS, NAA    EKG   Radiology No results found.  Procedures Procedures (including critical care time)  Medications Ordered in UC Medications - No data to display  Initial Impression / Assessment and Plan / UC Course  I have reviewed the triage vital signs and the nursing notes.  Pertinent labs & imaging results that were available during my care of the patient were reviewed by me and considered in my medical decision making (see chart for details).     Patient afebrile, nontoxic, with SpO2 97%.  Covid PCR pending.  Patient to quarantine until results are back.  We will treat supportively as outlined below.  Return precautions discussed, patient verbalized understanding and is agreeable to plan. Final Clinical Impressions(s) / UC Diagnoses   Final diagnoses:  Encounter for screening for COVID-19  Exposure to COVID-19 virus  Loss of smell  Loss of taste  Cough     Discharge Instructions     Your COVID test is  pending - it is important to quarantine / isolate at home until your results are back. If you test positive and would like further evaluation for persistent or worsening symptoms, you may schedule an E-visit or virtual (video) visit throughout the [facility] app or website.  PLEASE NOTE: If you develop severe chest pain or shortness of breath please go to the ER or call 9-1-1 for further evaluation --> DO NOT schedule electronic or virtual visits for this. Please call our office for further guidance / recommendations as needed.  For information about the Covid vaccine, please visit [email or web address]    ED Prescriptions    None     PDMP not reviewed this encounter.   Hall[place]Pot[place], [place], [place] 03/05/20 1507

## 2020-03-05 NOTE — Discharge Instructions (Signed)
Your COVID test is pending - it is important to quarantine / isolate at home until your results are back. °If you test positive and would like further evaluation for persistent or worsening symptoms, you may schedule an E-visit or virtual (video) visit throughout the [facility] MyChart app or website. ° °PLEASE NOTE: If you develop severe chest pain or shortness of breath please go to the ER or call 9-1-1 for further evaluation --> DO NOT schedule electronic or virtual visits for this. °Please call our office for further guidance / recommendations as needed. ° °For information about the Covid vaccine, please visit [email or web address] °

## 2020-03-07 LAB — SARS-COV-2, NAA 2 DAY TAT

## 2020-03-07 LAB — NOVEL CORONAVIRUS, NAA: SARS-CoV-2, NAA: DETECTED — AB

## 2022-01-28 ENCOUNTER — Other Ambulatory Visit: Payer: Self-pay

## 2022-01-28 ENCOUNTER — Emergency Department: Payer: Self-pay

## 2022-01-28 ENCOUNTER — Emergency Department
Admission: EM | Admit: 2022-01-28 | Discharge: 2022-01-28 | Disposition: A | Discharge status: Home / Self Care | Payer: Self-pay | Attending: Emergency Medicine | Admitting: Emergency Medicine

## 2022-01-28 ENCOUNTER — Encounter: Payer: Self-pay | Admitting: Emergency Medicine

## 2022-01-28 DIAGNOSIS — T310 Burns involving less than 10% of body surface: Secondary | ICD-10-CM | POA: Insufficient documentation

## 2022-01-28 DIAGNOSIS — T148XXA Other injury of unspecified body region, initial encounter: Secondary | ICD-10-CM

## 2022-01-28 DIAGNOSIS — T24231A Burn of second degree of right lower leg, initial encounter: Secondary | ICD-10-CM | POA: Insufficient documentation

## 2022-01-28 DIAGNOSIS — S81811A Laceration without foreign body, right lower leg, initial encounter: Secondary | ICD-10-CM | POA: Insufficient documentation

## 2022-01-28 DIAGNOSIS — X19XXXA Contact with other heat and hot substances, initial encounter: Secondary | ICD-10-CM | POA: Insufficient documentation

## 2022-01-28 DIAGNOSIS — W230XXA Caught, crushed, jammed, or pinched between moving objects, initial encounter: Secondary | ICD-10-CM | POA: Insufficient documentation

## 2022-01-28 MED ORDER — TRAMADOL HCL 50 MG PO TABS: 50.0000 mg | ORAL_TABLET | Freq: Four times a day (QID) | ORAL | 0 refills | Status: DC | PRN

## 2022-01-28 MED ORDER — SILVER SULFADIAZINE 1 % EX CREA
TOPICAL_CREAM | Freq: Once | CUTANEOUS | Status: AC
  Filled 2022-01-28: qty 20

## 2022-01-28 MED ORDER — OXYCODONE-ACETAMINOPHEN 5-325 MG PO TABS
1.0000 | ORAL_TABLET | Freq: Once | ORAL | Status: AC
  Administered 2022-01-28: 1 via ORAL
  Filled 2022-01-28: qty 1

## 2022-01-28 MED ORDER — LIDOCAINE VISCOUS HCL 2 % MT SOLN
15.0000 mL | Freq: Once | OROMUCOSAL | Status: AC
  Administered 2022-01-28: 15 mL via OROMUCOSAL
  Filled 2022-01-28: qty 15

## 2022-01-28 MED ORDER — LIDOCAINE HCL (PF) 1 % IJ SOLN
5.0000 mL | Freq: Once | INTRAMUSCULAR | Status: AC
  Administered 2022-01-28: 5 mL via INTRADERMAL
  Filled 2022-01-28: qty 5

## 2022-01-28 MED ORDER — LIDOCAINE-EPINEPHRINE-TETRACAINE (LET) TOPICAL GEL
3.0000 mL | Freq: Once | TOPICAL | Status: DC
  Filled 2022-01-28 (×2): qty 3

## 2022-01-28 MED ORDER — SILVER SULFADIAZINE 1 % EX CREA: TOPICAL_CREAM | CUTANEOUS | 1 refills | Status: AC

## 2022-01-28 MED ORDER — CEPHALEXIN 500 MG PO CAPS: 500.0000 mg | ORAL_CAPSULE | Freq: Three times a day (TID) | ORAL | 0 refills | Status: DC

## 2022-01-28 MED ORDER — CEPHALEXIN 500 MG PO CAPS
500.0000 mg | ORAL_CAPSULE | Freq: Once | ORAL | Status: AC
  Administered 2022-01-28: 500 mg via ORAL
  Filled 2022-01-28: qty 1
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
## 2022-01-28 NOTE — ED Notes (Signed)
Dressings placed to wounds and leg was wrapped prior to DC.

## 2022-01-28 NOTE — Discharge Instructions (Signed)
Apply ice to any areas that hurt.  Apply Silvadene to the burns for the next few days.  Once they start BLU may go to Neosporin. Keep the laceration as dry as possible.  You shower pat the area dry.  Do not poured hydrogen peroxide on the area Sutures should be removed in 12 to 14 days.  He may return the emergency department, see your regular doctor, go to urgent care, or remove them yourself. If you see any sign of infection return the emergency department
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
## 2022-02-07 DIAGNOSIS — T24331A Burn of third degree of right lower leg, initial encounter: Secondary | ICD-10-CM | POA: Insufficient documentation

## 2022-11-12 ENCOUNTER — Ambulatory Visit
Admission: EM | Admit: 2022-11-12 | Discharge: 2022-11-12 | Disposition: A | Discharge status: Home / Self Care | Payer: Commercial Managed Care - PPO | Attending: Urgent Care | Admitting: Urgent Care

## 2022-11-12 ENCOUNTER — Other Ambulatory Visit: Payer: Self-pay

## 2022-11-12 DIAGNOSIS — S058X1A Other injuries of right eye and orbit, initial encounter: Secondary | ICD-10-CM | POA: Diagnosis not present

## 2022-11-12 MED ORDER — KETOROLAC TROMETHAMINE 0.5 % OP SOLN
1.0000 [drp] | Freq: Four times a day (QID) | OPHTHALMIC | 0 refills | Status: DC
  Filled 2022-11-12: qty 5, 25d supply, fill #0

## 2022-11-12 MED ORDER — MOXIFLOXACIN HCL 0.5 % OP SOLN
1.0000 [drp] | Freq: Three times a day (TID) | OPHTHALMIC | 0 refills | Status: DC
  Filled 2022-11-12: qty 3, 20d supply, fill #0

## 2022-11-12 NOTE — ED Triage Notes (Signed)
Patient presents to [facility] for eye problem. He states he got debris in his right eye yesterday. Flushed it out, but still feels like something is in it.   Denies pain or changes in vision.

## 2022-11-12 NOTE — ED Provider Notes (Signed)
[facility]    CSN: [id number] Arrival date & time: 11/12/22  1511      History   Chief Complaint Chief Complaint  Patient presents with   Eye Problem    HPI Henry Garrison is a 22 y.o. male.    Eye Problem   Patient presents to urgent care with right eye discomfort.  Patient states he had foreign object in the right eye yesterday.  He states he flushed it out but still feels as if something is in it.  Denies pain or change in vision.  Patient states symptoms started after he got something in the eye while "grinding".  He states he feels "irritation.  History reviewed. No pertinent past medical history.  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 No pertinent surgical history.     Home Medications    Prior to Admission medications   Medication Sig Start Date End Date Taking? Authorizing Provider  cephALEXin (KEFLEX) 500 MG capsule Take 1 capsule (500 mg total) by mouth 3 (three) times daily. 01/28/22   Fisher, Roselyn Bering, PA-C  pantoprazole (PROTONIX) 40 MG tablet Take 1 tablet (40 mg total) by mouth daily. 05/29/19   Donita Brooks, MD  silver sulfADIAZINE (SILVADENE) 1 % cream Apply to affected area daily 01/28/22 01/28/23  Sherrie Mustache, Roselyn Bering, PA-C  traMADol (ULTRAM) 50 MG tablet Take 1 tablet (50 mg total) by mouth every 6 (six) hours as needed. 01/28/22   Fisher, Roselyn Bering, PA-C    Family History Family History  Problem Relation Age of Onset   Hypertension Mother    Cancer Father        cervical   Asthma Sister    Hypertension Maternal Grandmother    Hypertension Maternal Grandfather     Social History Social History   Tobacco Use   Smoking status: Never   Smokeless tobacco: Former  Substance Use Topics   Alcohol use: No    Alcohol/week: 0.0 standard drinks of alcohol    Drug use: No     Allergies   Patient has no known allergies.   Review of Systems Review of Systems   Physical Exam Triage Vital Signs ED Triage Vitals  Enc Vitals Group     BP 11/12/22 1524 135/87     Pulse Rate 11/12/22 1524 64     Resp 11/12/22 1524 16     Temp 11/12/22 1524 97.8 F (36.6 C)     Temp Source 11/12/22 1524 Temporal     SpO2 11/12/22 1524 97 %     Weight --      Height --      Head Circumference --      Peak Flow --      Pain Score 11/12/22 1517 0     Pain Loc --      Pain Edu? --      Excl. in GC? --    No data found.  Updated Vital Signs BP 135/87 (BP Location: Left Arm)   Pulse 64   Temp 97.8 F (36.6 C) (Temporal)   Resp 16   SpO2 97%   Visual Acuity Right Eye Distance:   Left Eye Distance:   Bilateral Distance:    Right Eye Near:   Left Eye Near:    Bilateral Near:  Physical Exam Vitals reviewed.  Constitutional:      Appearance: Normal appearance.  Eyes:   Skin:    General: Skin is warm and dry.  Neurological:     General: No focal deficit present.     Mental Status: He is alert and oriented to person, place, and time.  Psychiatric:        Mood and Affect: Mood normal.        Behavior: Behavior normal.      UC Treatments / Results  Labs (all labs ordered are listed, but only abnormal results are displayed) Labs Reviewed - No data to display  EKG   Radiology No results found.  Procedures Procedures (including critical care time)  Medications Ordered in UC Medications - No data to display  Initial Impression / Assessment and Plan / UC Course  I have reviewed the triage vital signs and the nursing notes.  Pertinent labs & imaging results that were available during my care of the patient were reviewed by me and considered in my medical decision making (see chart for details).   Flushed eye using normal saline.  Examined eye after instillation of fluorescein tape which showed bilateral abrasions.   Informed patient that I could not rule out presence of metal foreign body and if symptoms did not improve, he should go for eye evaluation.  Prescribing moxifloxacin to prevent infection as well as ketorolac eyedrops.  Reviewed chart history. Additional history obtained from patient family/caregiver present during the exam.  Counseled patient on potential for adverse effects with medications prescribed/recommended today, ER and return-to-clinic precautions discussed, patient verbalized understanding and agreement with care plan.   Final Clinical Impressions(s) / UC Diagnoses   Final diagnoses:  None   Discharge Instructions   None    ED Prescriptions   None    PDMP not reviewed this encounter.   Charma Igo, [place] 11/12/22 1546

## 2022-11-12 NOTE — Discharge Instructions (Signed)
Follow up here with eye evaluation if your symptoms are worsening or not improving with treatment.

## 2023-04-17 ENCOUNTER — Ambulatory Visit: Payer: Self-pay

## 2023-05-02 ENCOUNTER — Ambulatory Visit (INDEPENDENT_AMBULATORY_CARE_PROVIDER_SITE_OTHER): Payer: Commercial Managed Care - PPO | Admitting: Family Medicine

## 2023-05-02 VITALS — BP 120/68 | HR 97 | Temp 97.8°F | Ht 72.0 in | Wt 154.0 lb

## 2023-05-02 DIAGNOSIS — Z0000 Encounter for general adult medical examination without abnormal findings: Secondary | ICD-10-CM | POA: Diagnosis not present
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
## 2023-05-03 LAB — CBC WITH DIFFERENTIAL/PLATELET
Absolute Lymphocytes: 2176 {cells}/uL (ref 850–3900)
Absolute Monocytes: 552 {cells}/uL (ref 200–950)
Basophils Absolute: 37 {cells}/uL (ref 0–200)
Basophils Relative: 0.6 %
Eosinophils Absolute: 31 {cells}/uL (ref 15–500)
Eosinophils Relative: 0.5 %
HCT: 45.4 % (ref 38.5–50.0)
Hemoglobin: 15 g/dL (ref 13.2–17.1)
MCH: 30.5 pg (ref 27.0–33.0)
MCHC: 33 g/dL (ref 32.0–36.0)
MCV: 92.5 fL (ref 80.0–100.0)
MPV: 10.5 fL (ref 7.5–12.5)
Monocytes Relative: 8.9 %
Neutro Abs: 3404 {cells}/uL (ref 1500–7800)
Neutrophils Relative %: 54.9 %
Platelets: 271 Thousand/uL (ref 140–400)
RBC: 4.91 Million/uL (ref 4.20–5.80)
RDW: 12.2 % (ref 11.0–15.0)
Total Lymphocyte: 35.1 %
WBC: 6.2 Thousand/uL (ref 3.8–10.8)

## 2023-05-03 LAB — COMPLETE METABOLIC PANEL WITH GFR
AG Ratio: 1.7 (calc) (ref 1.0–2.5)
ALT: 19 U/L (ref 9–46)
AST: 19 U/L (ref 10–40)
Albumin: 4.8 g/dL (ref 3.6–5.1)
Alkaline phosphatase (APISO): 85 U/L (ref 36–130)
BUN: 10 mg/dL (ref 7–25)
CO2: 29 mmol/L (ref 20–32)
Calcium: 10 mg/dL (ref 8.6–10.3)
Chloride: 104 mmol/L (ref 98–110)
Creat: 0.96 mg/dL (ref 0.60–1.24)
Globulin: 2.8 g/dL (ref 1.9–3.7)
Glucose, Bld: 58 mg/dL — ABNORMAL LOW (ref 65–99)
Potassium: 4.3 mmol/L (ref 3.5–5.3)
Sodium: 142 mmol/L (ref 135–146)
Total Bilirubin: 0.6 mg/dL (ref 0.2–1.2)
Total Protein: 7.6 g/dL (ref 6.1–8.1)
eGFR: 113 mL/min/{1.73_m2} (ref >=60)

## 2023-05-03 LAB — LIPID PANEL
Cholesterol: 172 mg/dL (ref <200)
HDL: 73 mg/dL (ref >=40)
LDL Cholesterol (Calc): 85 mg/dL
Non-HDL Cholesterol (Calc): 99 mg/dL (ref <130)
Total CHOL/HDL Ratio: 2.4 (calc) (ref <5.0)
Triglycerides: 60 mg/dL (ref <150)

## 2024-05-04 ENCOUNTER — Ambulatory Visit: Admitting: Family Medicine

## 2024-05-04 ENCOUNTER — Encounter: Payer: Self-pay | Admitting: Family Medicine

## 2024-05-04 VITALS — BP 118/72 | HR 89 | Temp 98.5°F | Ht 72.0 in | Wt 145.4 lb

## 2024-05-04 DIAGNOSIS — Z0000 Encounter for general adult medical examination without abnormal findings: Secondary | ICD-10-CM | POA: Diagnosis not present

## 2024-05-04 DIAGNOSIS — Z23 Encounter for immunization: Secondary | ICD-10-CM | POA: Diagnosis not present

## 2024-05-04 LAB — LIPID PANEL
Cholesterol: 152 mg/dL (ref <200)
HDL: 67 mg/dL (ref >=40)
LDL Cholesterol (Calc): 71 mg/dL
Non-HDL Cholesterol (Calc): 85 mg/dL (ref <130)
Total CHOL/HDL Ratio: 2.3 (calc) (ref <5.0)
Triglycerides: 67 mg/dL (ref <150)

## 2024-05-04 LAB — CBC WITH DIFFERENTIAL/PLATELET
Absolute Lymphocytes: 1544 {cells}/uL (ref 850–3900)
Absolute Monocytes: 405 {cells}/uL (ref 200–950)
Basophils Absolute: 48 {cells}/uL (ref 0–200)
Basophils Relative: 1.1 %
Eosinophils Absolute: 48 {cells}/uL (ref 15–500)
Eosinophils Relative: 1.1 %
HCT: 43.3 % (ref 38.5–50.0)
Hemoglobin: 14.3 g/dL (ref 13.2–17.1)
MCH: 31.4 pg (ref 27.0–33.0)
MCHC: 33 g/dL (ref 32.0–36.0)
MCV: 95.2 fL (ref 80.0–100.0)
MPV: 10.6 fL (ref 7.5–12.5)
Monocytes Relative: 9.2 %
Neutro Abs: 2354 {cells}/uL (ref 1500–7800)
Neutrophils Relative %: 53.5 %
Platelets: 243 Thousand/uL (ref 140–400)
RBC: 4.55 Million/uL (ref 4.20–5.80)
RDW: 11.9 % (ref 11.0–15.0)
Total Lymphocyte: 35.1 %
WBC: 4.4 Thousand/uL (ref 3.8–10.8)

## 2024-05-04 LAB — COMPREHENSIVE METABOLIC PANEL WITH GFR
AG Ratio: 2 (calc) (ref 1.0–2.5)
ALT: 24 U/L (ref 9–46)
AST: 25 U/L (ref 10–40)
Albumin: 4.8 g/dL (ref 3.6–5.1)
Alkaline phosphatase (APISO): 69 U/L (ref 36–130)
BUN: 10 mg/dL (ref 7–25)
CO2: 31 mmol/L (ref 20–32)
Calcium: 9.4 mg/dL (ref 8.6–10.3)
Chloride: 103 mmol/L (ref 98–110)
Creat: 0.92 mg/dL (ref 0.60–1.24)
Globulin: 2.4 g/dL (ref 1.9–3.7)
Glucose, Bld: 103 mg/dL — ABNORMAL HIGH (ref 65–99)
Potassium: 4.6 mmol/L (ref 3.5–5.3)
Sodium: 140 mmol/L (ref 135–146)
Total Bilirubin: 0.6 mg/dL (ref 0.2–1.2)
Total Protein: 7.2 g/dL (ref 6.1–8.1)
eGFR: 118 mL/min/1.73m2 (ref >=60)

## 2024-05-04 NOTE — Progress Notes (Signed)
 Subjective:    Patient ID: Arthea Dross, male    DOB: [birth year], 23 y.o.   MRN: [id number]  HPI Patient is a very pleasant 23 year old Caucasian gentleman here today for a physical exam.  The patient works as a games developer.  Patient does not smoke.  He does use oral tobacco.  I recommended against this due to the risk of oral cancer.  He declines a flu shot.  We discussed HPV vaccination and he is interested in receiving this.  Otherwise he is doing well with no concerns. History reviewed. No pertinent past medical history. History reviewed. No pertinent surgical history. No current outpatient medications on file prior to visit.   No current facility-administered medications on file prior to visit.   No Known Allergies Social History   Socioeconomic History  . Marital status: Single    Spouse name: Not on file  . Number of children: Not on file  . Years of education: Not on file  . Highest education level: Not on file  Occupational History  . Not on file  Tobacco Use  . Smoking status: Never  . Smokeless tobacco: Current    Types: Chew  Substance and Sexual Activity  . Alcohol use: No    Alcohol/week: 0.0 standard drinks of alcohol  . Drug use: No  . Sexual activity: Never  Other Topics Concern  . Not on file  Social History Narrative   ** Merged History Encounter **       Social Drivers of Corporate Investment Banker Strain: Not on file  Food Insecurity: Not on file  Transportation Needs: Not on file  Physical Activity: Not on file  Stress: Not on file  Social Connections: Unknown (11/05/2021)   Received from [facility]   Social Network   . Social Network: Not on file  Intimate Partner Violence: Unknown (09/27/2021)   Received from [facility]   HITS   . Physically Hurt: Not on file   . Insult or Talk Down To: Not on file   . Threaten Physical Harm: Not on file   . Scream or Curse: Not on file   Family History  Problem Relation Age of Onset  .  Hypertension Mother   . Cancer Father        cervical  . Asthma Sister   . Hypertension Maternal Grandmother   . Hypertension Maternal Grandfather       Review of Systems  All other systems reviewed and are negative.      Objective:   Physical Exam Vitals reviewed.  Constitutional:      General: He is not in acute distress.    Appearance: He is well-developed. He is not diaphoretic.  HENT:     Head: Normocephalic and atraumatic.     Right Ear: External ear normal.     Left Ear: External ear normal.     Nose: Nose normal.     Mouth/Throat:     Dentition: Gingival swelling present.     Pharynx: No oropharyngeal exudate.  Eyes:     General: No scleral icterus.       Right eye: No discharge.        Left eye: No discharge.     Conjunctiva/sclera: Conjunctivae normal.     Pupils: Pupils are equal, round, and reactive to light.  Neck:     Thyroid: No thyromegaly.     Vascular: No JVD.     Trachea: No tracheal deviation.  Cardiovascular:  Rate and Rhythm: Normal rate and regular rhythm.     Heart sounds: Normal heart sounds. No murmur heard.    No friction rub. No gallop.  Pulmonary:     Effort: Pulmonary effort is normal. No respiratory distress.     Breath sounds: Normal breath sounds. No stridor. No wheezing or rales.  Chest:     Chest wall: No tenderness.  Abdominal:     General: Bowel sounds are normal. There is no distension.     Palpations: Abdomen is soft. There is no mass.     Tenderness: There is no abdominal tenderness. There is no guarding or rebound.  Genitourinary:    Penis: Normal.   Musculoskeletal:        General: No tenderness. Normal range of motion.     Cervical back: Normal range of motion and neck supple.  Lymphadenopathy:     Cervical: No cervical adenopathy.  Skin:    General: Skin is warm.     Coloration: Skin is not pale.     Findings: No erythema or rash.  Neurological:     Mental Status: He is alert and oriented to person, place,  and time.     Cranial Nerves: No cranial nerve deficit.     Motor: No abnormal muscle tone.     Coordination: Coordination normal.     Deep Tendon Reflexes: Reflexes are normal and symmetric.  Psychiatric:        Behavior: Behavior normal.        Thought Content: Thought content normal.        Judgment: Judgment normal.           Assessment & Plan:  General medical exam - Plan: CBC with Differential/Platelet, Comprehensive metabolic panel with GFR, Lipid panel Patient's physical exam today is normal.  Recommended discontinuation of oral tobacco.  Offered a flu shot which the patient politely declined.  We discussed HPV vaccination if he would like to proceed with this.  I will check a CBC, CMP, and a fasting lipid panel.

## 2024-05-05 ENCOUNTER — Ambulatory Visit: Payer: Self-pay | Admitting: Family Medicine

## 2024-06-29 ENCOUNTER — Ambulatory Visit (INDEPENDENT_AMBULATORY_CARE_PROVIDER_SITE_OTHER)

## 2024-06-29 DIAGNOSIS — Z23 Encounter for immunization: Secondary | ICD-10-CM

## 2024-06-29 NOTE — Progress Notes (Signed)
 Patient is in office today for a nurse visit for Immunization. Patient Injection was given in the  Left deltoid. Patient tolerated injection well.
